# Patient Record
Sex: Female | Born: 1990 | Race: Black or African American | Hispanic: No | Marital: Single | Smoking: Current every day smoker
Health system: Southern US, Community
[De-identification: ages and names within clinical notes are randomized; demographics above are authoritative.]

## PROBLEM LIST (undated history)

## (undated) ENCOUNTER — Inpatient Hospital Stay (HOSPITAL_COMMUNITY): Payer: Self-pay

## (undated) DIAGNOSIS — J45909 Unspecified asthma, uncomplicated: Secondary | ICD-10-CM

## (undated) DIAGNOSIS — J302 Other seasonal allergic rhinitis: Secondary | ICD-10-CM

## (undated) DIAGNOSIS — N739 Female pelvic inflammatory disease, unspecified: Secondary | ICD-10-CM

## (undated) HISTORY — PX: EYE SURGERY: SHX253

---

## 2012-11-16 ENCOUNTER — Encounter (HOSPITAL_COMMUNITY): Payer: Self-pay | Admitting: Emergency Medicine

## 2012-11-16 ENCOUNTER — Emergency Department (INDEPENDENT_AMBULATORY_CARE_PROVIDER_SITE_OTHER)
Admission: EM | Admit: 2012-11-16 | Discharge: 2012-11-16 | Disposition: A | Discharge status: Home / Self Care | Payer: Self-pay | Source: Home / Self Care | Attending: Emergency Medicine | Admitting: Emergency Medicine

## 2012-11-16 DIAGNOSIS — J309 Allergic rhinitis, unspecified: Secondary | ICD-10-CM

## 2012-11-16 DIAGNOSIS — L259 Unspecified contact dermatitis, unspecified cause: Secondary | ICD-10-CM

## 2012-11-16 DIAGNOSIS — Z8669 Personal history of other diseases of the nervous system and sense organs: Secondary | ICD-10-CM

## 2012-11-16 DIAGNOSIS — L309 Dermatitis, unspecified: Secondary | ICD-10-CM

## 2012-11-16 DIAGNOSIS — R197 Diarrhea, unspecified: Secondary | ICD-10-CM

## 2012-11-16 HISTORY — DX: Unspecified asthma, uncomplicated: J45.909

## 2012-11-16 MED ORDER — TRIAMCINOLONE ACETONIDE 0.1 % EX CREA: TOPICAL_CREAM | Freq: Three times a day (TID) | CUTANEOUS | Status: DC

## 2012-11-16 MED ORDER — SUMATRIPTAN SUCCINATE 50 MG PO TABS: 50.0000 mg | ORAL_TABLET | ORAL | Status: DC | PRN

## 2012-11-16 MED ORDER — HYDROXYZINE HCL 25 MG PO TABS: 25.0000 mg | ORAL_TABLET | Freq: Four times a day (QID) | ORAL | Status: DC

## 2012-11-16 MED ORDER — PREDNISONE 20 MG PO TABS: 20.0000 mg | ORAL_TABLET | Freq: Two times a day (BID) | ORAL | Status: DC

## 2012-11-16 MED ORDER — METHYLPREDNISOLONE ACETATE 80 MG/ML IJ SUSP
INTRAMUSCULAR | Status: AC
  Filled 2012-11-16: qty 1

## 2012-11-16 MED ORDER — METHYLPREDNISOLONE ACETATE 80 MG/ML IJ SUSP
80.0000 mg | Freq: Once | INTRAMUSCULAR | Status: AC
  Administered 2012-11-16: 80 mg via INTRAMUSCULAR

## 2012-11-16 NOTE — ED Provider Notes (Signed)
Chief Complaint:   Chief Complaint  Patient presents with  . Rash    History of Present Illness:   Christina Knapp is a 22 year old female who has a two-day history of a rash. This first broke out on her right shoulder and it spread down the right arm and the left arm as well involving her trunk and her legs. She has lesions on her hands and feet but on the palms or soles. She has no lesions on her face, head, or neck, no intraoral lesions or sore throat. She denies any fever, chills, aches, or sweats. She's had no URI symptoms. She denies any changes in soaps, detergents, washing powders, dryer sheets, or fabric softeners. No exposure to cosmetics or chemicals at work or at home. She works as a Conservation officer, nature at [employer]. No one around her has had a similar rash. She denies any recent travel or hotels stays. She has had no trouble breathing and denies any swelling of the lips, tongue, or throat. She notes that she's never had chickenpox.  She has had a long-standing history of trouble with her bowel movements with first constipation and now loose stools. She also has occasional migraine headaches and symptoms of allergic rhinitis. She doesn't have a primary care doctor.  Review of Systems:  Other than noted above, the patient denies any of the following symptoms: Systemic:  No fever, chills, sweats, weight loss, or fatigue. ENT:  No nasal congestion, rhinorrhea, sore throat, swelling of lips, tongue or throat. Resp:  No cough, wheezing, or shortness of breath. Skin:  No rash, itching, nodules, or suspicious lesions.  PMFSH:  Past medical history, family history, social history, meds, and allergies were reviewed. She has a [employer] fast streamer. She has a history of preeclampsia and asthma.  Physical Exam:   Vital signs:  BP 115/66  Pulse 68  Temp(Src) 98 F (36.7 C) (Oral)  Resp 16  SpO2 100% Gen:  Alert, oriented, in no distress. ENT:  Pharynx clear, no intraoral lesions, moist mucous membranes. Lungs:  Clear  to auscultation. Skin:  There is scattered erythematous maculopapules. No vesicles. These involve the trunk, arms, hands, legs, and feet but none of the palms or soles. There no intraoral lesions and no lesions on the face or neck.  Assessment:  The primary encounter diagnosis was Dermatitis. Diagnoses of Diarrhea, History of migraine headaches, and Allergic rhinitis were also pertinent to this visit.  Dermatitis, possibly viral infection or allergy. Does not appear to be chickenpox.  Plan:   1.  The following meds were prescribed:   Discharge Medication List as of 11/16/2012  6:44 PM    START taking these medications   Details  hydrOXYzine (ATARAX/VISTARIL) 25 MG tablet Take 1 tablet (25 mg total) by mouth every 6 (six) hours., Starting 11/16/2012, Until Discontinued, Normal    predniSONE (DELTASONE) 20 MG tablet Take 1 tablet (20 mg total) by mouth 2 (two) times daily., Starting 11/16/2012, Until Discontinued, Normal    SUMAtriptan (IMITREX) 50 MG tablet Take 1 tablet (50 mg total) by mouth every 2 (two) hours as needed for migraine., Starting 11/16/2012, Until Discontinued, Normal    triamcinolone cream (KENALOG) 0.1 % Apply topically 3 (three) times daily., Starting 11/16/2012, Until Discontinued, Normal       2.  The patient was instructed in symptomatic care and handouts were given. 3.  The patient was told to return if becoming worse in any way, if no better in 3 or 4 days, and given some red flag  symptoms such as fever or worsening rash that would indicate earlier return. 4.  Follow up here if necessary. Also suggested she followup with Dr. Charlott Rakes with regard to her problem with diarrhea. I suggested she try a probiotic. She also needs a primary care physician and was given the name of the [facility] and [facility].     Reuben Likes, MD 11/16/12 [phone]

## 2012-11-16 NOTE — ED Notes (Signed)
C/o rash that appeared last night. Pt denies any changes in soaps or detergents.  Denies any recent hotel stays etc.

## 2013-01-15 ENCOUNTER — Encounter (HOSPITAL_COMMUNITY): Payer: Self-pay | Admitting: *Deleted

## 2013-01-15 ENCOUNTER — Inpatient Hospital Stay (HOSPITAL_COMMUNITY)
Admission: AD | Admit: 2013-01-15 | Discharge: 2013-01-15 | Disposition: A | Discharge status: Home / Self Care | Payer: Self-pay | Source: Ambulatory Visit | Attending: Obstetrics & Gynecology | Admitting: Obstetrics & Gynecology

## 2013-01-15 DIAGNOSIS — Z30432 Encounter for removal of intrauterine contraceptive device: Secondary | ICD-10-CM | POA: Insufficient documentation

## 2013-01-15 DIAGNOSIS — Z3009 Encounter for other general counseling and advice on contraception: Secondary | ICD-10-CM

## 2013-01-15 DIAGNOSIS — N938 Other specified abnormal uterine and vaginal bleeding: Secondary | ICD-10-CM | POA: Insufficient documentation

## 2013-01-15 DIAGNOSIS — R102 Pelvic and perineal pain: Secondary | ICD-10-CM

## 2013-01-15 DIAGNOSIS — N939 Abnormal uterine and vaginal bleeding, unspecified: Secondary | ICD-10-CM

## 2013-01-15 DIAGNOSIS — N949 Unspecified condition associated with female genital organs and menstrual cycle: Secondary | ICD-10-CM | POA: Insufficient documentation

## 2013-01-15 HISTORY — DX: Other seasonal allergic rhinitis: J30.2

## 2013-01-15 HISTORY — DX: Female pelvic inflammatory disease, unspecified: N73.9

## 2013-01-15 LAB — URINALYSIS, ROUTINE W REFLEX MICROSCOPIC
Glucose, UA: NEGATIVE mg/dL
Hgb urine dipstick: NEGATIVE
Specific Gravity, Urine: >1.03 — ABNORMAL HIGH (ref 1.005–1.030)
Urobilinogen, UA: 0.2 mg/dL (ref 0.0–1.0)
pH: 6 (ref 5.0–8.0)

## 2013-01-15 LAB — WET PREP, GENITAL
Trich, Wet Prep: NONE SEEN
Yeast Wet Prep HPF POC: NONE SEEN

## 2013-01-15 LAB — POCT PREGNANCY, URINE: Preg Test, Ur: NEGATIVE

## 2013-01-15 NOTE — MAU Note (Signed)
C/o spotting pinkish discharge with cramping; has had IUD for 6 months; has infant son that is 7 months;

## 2013-01-15 NOTE — MAU Provider Note (Signed)
Chief Complaint: Pelvic Pain and Vaginal Bleeding   First Provider Initiated Contact with Patient 01/15/13 1757     SUBJECTIVE HPI: Christina Knapp is a 22 y.o. [id number] who presents to maternity admissions reporting cramping/sharp pain in her pelvis and spotting intermittently x6 months, since her Mirena IUD was placed by her provider in [place], [place].  She had negative UPTs at home but was concerned so came to be tested here.  She is unhappy with the Mirena if that is the cause of her symptoms.  Pt had Mirena prior to this one that did not cause symptoms this severe.  She reports she was going through divorce following the birth of her child 7 months ago and did not want to become pregnant again with her husband, but if she became pregnant now with her boyfriend, she would be happy about the pregnancy.  She denies LOF, vaginal itching/burning, urinary symptoms, h/a, dizziness, n/v, or fever/chills.     Past Medical History  Diagnosis Date  . Asthma   . Seasonal allergies   . PID (pelvic inflammatory disease)    Past Surgical History  Procedure Laterality Date  . Eye surgery    . Eye surgery     History   Social History  . Marital Status: Single    Spouse Name: N/A    Number of Children: N/A  . Years of Education: N/A   Occupational History  . Not on file.   Social History Main Topics  . Smoking status: Current Every Day Smoker -- 1.00 packs/day    Types: Cigarettes  . Smokeless tobacco: Not on file  . Alcohol Use: Yes  . Drug Use: No  . Sexual Activity: Yes    Birth Control/ Protection: IUD   Other Topics Concern  . Not on file   Social History Narrative  . No narrative on file   No current facility-administered medications on file prior to encounter.   Current Outpatient Prescriptions on File Prior to Encounter  Medication Sig Dispense Refill  . hydrOXYzine (ATARAX/VISTARIL) 25 MG tablet Take 1 tablet (25 mg total) by mouth every 6 (six) hours.  12 tablet  0  .  predniSONE (DELTASONE) 20 MG tablet Take 1 tablet (20 mg total) by mouth 2 (two) times daily.  10 tablet  0  . SUMAtriptan (IMITREX) 50 MG tablet Take 1 tablet (50 mg total) by mouth every 2 (two) hours as needed for migraine.  10 tablet  0  . triamcinolone cream (KENALOG) 0.1 % Apply topically 3 (three) times daily.  30 g  0   No Known Allergies  ROS: Pertinent items in HPI  OBJECTIVE Blood pressure 126/73, pulse 68, temperature 98.3 F (36.8 C), temperature source Oral, resp. rate 16, height 5\' 10"  (1.778 m), weight 81.647 kg (180 lb). GENERAL: Well-developed, well-nourished female in no acute distress.  HEENT: Normocephalic HEART: normal rate RESP: normal effort ABDOMEN: Soft, non-tender EXTREMITIES: Nontender, no edema NEURO: Alert and oriented Pelvic exam: Cervix pink, visually closed, without lesion, scant clear discharge, Mirena string clearly visible, vaginal walls and external genitalia normal Bimanual exam: Cervix 0/long/high, firm, anterior, neg CMT, uterus nontender, nonenlarged, adnexa without tenderness, enlargement, or mass  Mirena removed in MAU without difficulty.  Pt tolerated well.    LAB RESULTS Results for orders placed during the hospital encounter of 01/15/13 (from the past 24 hour(s))  URINALYSIS, ROUTINE W REFLEX MICROSCOPIC     Status: Abnormal   Collection Time    01/15/13  5:00 PM  Result Value Range   Color, Urine AMBER (*) YELLOW   APPearance CLEAR  CLEAR   Specific Gravity, Urine >1.030 (*) 1.005 - 1.030   pH 6.0  5.0 - 8.0   Glucose, UA NEGATIVE  NEGATIVE mg/dL   Hgb urine dipstick NEGATIVE  NEGATIVE   Bilirubin Urine SMALL (*) NEGATIVE   Ketones, ur 15 (*) NEGATIVE mg/dL   Protein, ur NEGATIVE  NEGATIVE mg/dL   Urobilinogen, UA 0.2  0.0 - 1.0 mg/dL   Nitrite NEGATIVE  NEGATIVE   Leukocytes, UA NEGATIVE  NEGATIVE  POCT PREGNANCY, URINE     Status: None   Collection Time    01/15/13  5:11 PM      Result Value Range   Preg Test, Ur  NEGATIVE  NEGATIVE    ASSESSMENT 1. Encounter for IUD removal   2. General counseling and advice for contraceptive management   3. Pelvic pain   4. Vaginal spotting     PLAN Contraceptive counseling provided.  Discussed immediate return of fertility after Mirena removed.  Pt desires pregnancy in next year with her new partner.  No contraception desired.   Discharge home F/U with [facility] as planned when insurance starts Return to MAU as needed    Medication List    ASK your doctor about these medications       hydrOXYzine 25 MG tablet  Commonly known as:  ATARAX/VISTARIL  Take 1 tablet (25 mg total) by mouth every 6 (six) hours.     predniSONE 20 MG tablet  Commonly known as:  DELTASONE  Take 1 tablet (20 mg total) by mouth 2 (two) times daily.     SUMAtriptan 50 MG tablet  Commonly known as:  IMITREX  Take 1 tablet (50 mg total) by mouth every 2 (two) hours as needed for migraine.     triamcinolone cream 0.1 %  Commonly known as:  KENALOG  Apply topically 3 (three) times daily.         Sharen Counter Certified Nurse-Midwife 01/15/2013  6:17 PM

## 2013-01-15 NOTE — MAU Note (Signed)
Had negative HPT but has not the symptoms of pregnancy; UPT in MAU is negative but pt declares that she is pregnant and that it's the IUD that is causing the UPT to be negative; desires to have IUD removed;

## 2013-01-17 NOTE — MAU Provider Note (Signed)
Attestation of Attending Supervision of Advanced Practitioner (CNM/NP): Evaluation and management procedures were performed by the Advanced Practitioner under my supervision and collaboration.  I have reviewed the Advanced Practitioner's note and chart, and I agree with the management and plan.  HARRAWAY-SMITH, Amamda Curbow 12:17 PM

## 2013-04-29 ENCOUNTER — Inpatient Hospital Stay (HOSPITAL_COMMUNITY)
Admission: AD | Admit: 2013-04-29 | Discharge: 2013-04-29 | Disposition: A | Discharge status: Home / Self Care | Payer: Self-pay | Source: Ambulatory Visit | Attending: Obstetrics & Gynecology | Admitting: Obstetrics & Gynecology

## 2013-04-29 ENCOUNTER — Encounter (HOSPITAL_COMMUNITY): Payer: Self-pay

## 2013-04-29 ENCOUNTER — Inpatient Hospital Stay (HOSPITAL_COMMUNITY): Payer: Self-pay

## 2013-04-29 DIAGNOSIS — O360110 Maternal care for anti-D [Rh] antibodies, first trimester, not applicable or unspecified: Secondary | ICD-10-CM

## 2013-04-29 DIAGNOSIS — F17200 Nicotine dependence, unspecified, uncomplicated: Secondary | ICD-10-CM | POA: Insufficient documentation

## 2013-04-29 DIAGNOSIS — R51 Headache: Secondary | ICD-10-CM | POA: Insufficient documentation

## 2013-04-29 DIAGNOSIS — O0990 Supervision of high risk pregnancy, unspecified, unspecified trimester: Secondary | ICD-10-CM

## 2013-04-29 DIAGNOSIS — Z3201 Encounter for pregnancy test, result positive: Secondary | ICD-10-CM | POA: Insufficient documentation

## 2013-04-29 DIAGNOSIS — M545 Low back pain, unspecified: Secondary | ICD-10-CM | POA: Insufficient documentation

## 2013-04-29 DIAGNOSIS — O9989 Other specified diseases and conditions complicating pregnancy, childbirth and the puerperium: Secondary | ICD-10-CM

## 2013-04-29 DIAGNOSIS — R1084 Generalized abdominal pain: Secondary | ICD-10-CM

## 2013-04-29 DIAGNOSIS — O99891 Other specified diseases and conditions complicating pregnancy: Secondary | ICD-10-CM

## 2013-04-29 LAB — HCG, QUANTITATIVE, PREGNANCY: HCG, BETA CHAIN, QUANT, S: 1417 m[IU]/mL — AB (ref <5)

## 2013-04-29 LAB — URINALYSIS, ROUTINE W REFLEX MICROSCOPIC
Bilirubin Urine: NEGATIVE
Glucose, UA: NEGATIVE mg/dL
Hgb urine dipstick: NEGATIVE
KETONES UR: NEGATIVE mg/dL
LEUKOCYTES UA: NEGATIVE
NITRITE: NEGATIVE
PH: 6 (ref 5.0–8.0)
Protein, ur: NEGATIVE mg/dL
UROBILINOGEN UA: 0.2 mg/dL (ref 0.0–1.0)

## 2013-04-29 LAB — CBC
HCT: 39 % (ref 36.0–46.0)
Hemoglobin: 13.5 g/dL (ref 12.0–15.0)
MCH: 31.2 pg (ref 26.0–34.0)
MCHC: 34.6 g/dL (ref 30.0–36.0)
MCV: 90.1 fL (ref 78.0–100.0)
PLATELETS: 158 10*3/uL (ref 150–400)
RBC: 4.33 MIL/uL (ref 3.87–5.11)
RDW: 12.3 % (ref 11.5–15.5)
WBC: 4.7 10*3/uL (ref 4.0–10.5)

## 2013-04-29 LAB — WET PREP, GENITAL
Trich, Wet Prep: NONE SEEN
Yeast Wet Prep HPF POC: NONE SEEN

## 2013-04-29 LAB — POCT PREGNANCY, URINE: Preg Test, Ur: POSITIVE — AB

## 2013-04-29 MED ORDER — ACETAMINOPHEN 500 MG PO TABS
1000.0000 mg | ORAL_TABLET | ORAL | Status: AC
  Administered 2013-04-29: 1000 mg via ORAL
  Filled 2013-04-29: qty 2

## 2013-04-29 NOTE — Discharge Instructions (Signed)
Ectopic Pregnancy °An ectopic pregnancy is when the fertilized egg attaches (implants) outside the uterus. Most ectopic pregnancies occur in the fallopian tube. Rarely do ectopic pregnancies occur on the ovary, intestine, pelvis, or cervix. In an ectopic pregnancy, the fertilized egg does not have the ability to develop into a normal, healthy baby.  °A ruptured ectopic pregnancy is one in which the fallopian tube gets torn or bursts and results in internal bleeding. Often there is intense abdominal pain, and sometimes, vaginal bleeding. Having an ectopic pregnancy can be life threatening. If left untreated, this dangerous condition can lead to a blood transfusion, abdominal surgery, or even death. °CAUSES  °Damage to the fallopian tubes is the suspected cause in most ectopic pregnancies.  °RISK FACTORS °Depending on your circumstances, the risk of having an ectopic pregnancy will vary. The level of risk can be divided into three categories. °High Risk °· You have gone through infertility treatment. °· You have had a previous ectopic pregnancy. °· You have had previous tubal surgery. °· You have had previous surgery to have the fallopian tubes tied (tubal ligation). °· You have tubal problems or diseases. °· You have been exposed to DES. DES is a medicine that was used until 1971 and had effects on babies whose mothers took the medicine. °· You become pregnant while using an intrauterine device (IUD) for birth control.  °Moderate Risk °· You have a history of infertility. °· You have a history of a sexually transmitted infection (STI). °· You have a history of pelvic inflammatory disease (PID). °· You have scarring from endometriosis. °· You have multiple sexual partners. °· You smoke.  °Low Risk °· You have had previous pelvic surgery. °· You use vaginal douching. °· You became sexually active before 24 years of age. °SIGNS AND SYMPTOMS  °An ectopic pregnancy should be suspected in anyone who has missed a period and  has abdominal pain or bleeding. °· You may experience normal pregnancy symptoms, such as: °· Nausea. °· Tiredness. °· Breast tenderness. °· Other symptoms may include: °· Pain with intercourse. °· Irregular vaginal bleeding or spotting. °· Cramping or pain on one side or in the lower abdomen. °· Fast heartbeat. °· Passing out while having a bowel movement. °· Symptoms of a ruptured ectopic pregnancy and internal bleeding may include: °· Sudden, severe pain in the abdomen and pelvis. °· Dizziness or fainting. °· Pain in the shoulder area. °DIAGNOSIS  °Tests that may be performed include: °· A pregnancy test. °· An ultrasound test. °· Testing the specific level of pregnancy hormone in the bloodstream. °· Taking a sample of uterus tissue (dilation and curettage, D&C). °· Surgery to perform a visual exam of the inside of the abdomen using a thin, lighted tube with a tiny camera on the end (laparoscope). °TREATMENT  °An injection of a medicine called methotrexate may be given. This medicine causes the pregnancy tissue to be absorbed. It is given if: °· The diagnosis is made early. °· The fallopian tube has not ruptured. °· You are considered to be a good candidate for the medicine. °Usually, pregnancy hormone blood levels are checked after methotrexate treatment. This is to be sure the medicine is effective. It may take 4 6 weeks for the pregnancy to be absorbed (though most pregnancies will be absorbed by 3 weeks). °Surgical treatment may be needed. A laparoscope may be used to remove the pregnancy tissue. If severe internal bleeding occurs, a cut (incision) may be made in the lower abdomen (laparotomy), and the   ectopic pregnancy is removed. This stops the bleeding. Part of the fallopian tube, or the whole tube, may be removed as well (salpingectomy). After surgery, pregnancy hormone tests may be done to be sure there is no pregnancy tissue left. You may receive an Rho(D) immune globulin shot if you are Rh negative and  the father is Rh positive, or if you do not know the Rh type of the father. This is to prevent problems with any future pregnancy. °SEEK IMMEDIATE MEDICAL CARE IF:  °You have any symptoms of an ectopic pregnancy. This is a medical emergency. °Document Released: 04/21/2004 Document Revised: 01/02/2013 Document Reviewed: 10/11/2012 °ExitCare® Patient Information ©2014 [employer]. ° °

## 2013-04-29 NOTE — MAU Provider Note (Signed)
History     CSN: [id number]  Arrival date and time: 04/29/13 1016   First Provider Initiated Contact with Patient 04/29/13 1340      Chief Complaint  Patient presents with  . Pregnancy Test    HPI Ms. Christina Knapp is a 23 y.o. [id number] at 683w6d by LMP who presents with a 2-day history of headache and lower back pain.  She denies vaginal discharge, vaginal bleeding, vaginal odor, itching, and burning. She has not taken medication for her headache or back pain, and states she has not eaten and is dehydrated. She reports nausea and photosensitivity, but denies vomiting. The headache is described as constant pain rated 9/10. She has not yet received prenatal care for this pregnancy and wishes [facility].  During her second pregnancy, she states that she did not receive Rhogam. The baby was born at 36 weeks and required 2 units of blood at birth.   Past Medical History  Diagnosis Date  . Asthma   . Seasonal allergies   . PID (pelvic inflammatory disease)     Past Surgical History  Procedure Laterality Date  . Eye surgery    . Eye surgery      Family History  Problem Relation Age of Onset  . Hypertension Mother   . Hypertension Maternal Grandmother   . Cancer Maternal Grandmother   . Hypertension Maternal Grandfather   . Diabetes Maternal Grandfather     History  Substance Use Topics  . Smoking status: Current Every Day Smoker -- 0.25 packs/day    Types: Cigarettes  . Smokeless tobacco: Not on file  . Alcohol Use: Yes    Allergies: No Known Allergies  No prescriptions prior to admission    Review of Systems  Eyes: Negative for blurred vision and double vision.  Gastrointestinal: Positive for nausea. Negative for vomiting.  Genitourinary: Negative for dysuria.  Musculoskeletal: Positive for back pain.       Low back pain, began 2 days ago  Neurological: Positive for headaches.   Physical Exam   Blood pressure 133/80, pulse 76,  temperature 99.1 F (37.3 C), temperature source Oral, resp. rate 18, height 5\' 10"  (1.778 m), weight 81.761 kg (180 lb 4 oz), last menstrual period 03/26/2013.  Physical Exam  Constitutional: She appears well-developed and well-nourished.  photosensitivity  Genitourinary: Uterus is not tender. Cervix exhibits no motion tenderness. Right adnexum displays no tenderness. Left adnexum displays no tenderness. No tenderness or bleeding around the vagina. No vaginal discharge found.  Abdomen: soft, non-tender, no rebound tenderness or guarding Back: Negative CVA tenderness  Results for orders placed during the hospital encounter of 04/29/13 (from the past 72 hour(s))  URINALYSIS, ROUTINE W REFLEX MICROSCOPIC     Status: Abnormal   Collection Time    04/29/13 11:41 AM      Result Value Range   Color, Urine YELLOW  YELLOW   APPearance CLEAR  CLEAR   Specific Gravity, Urine >1.030 (*) 1.005 - 1.030   pH 6.0  5.0 - 8.0   Glucose, UA NEGATIVE  NEGATIVE mg/dL   Hgb urine dipstick NEGATIVE  NEGATIVE   Bilirubin Urine NEGATIVE  NEGATIVE   Ketones, ur NEGATIVE  NEGATIVE mg/dL   Protein, ur NEGATIVE  NEGATIVE mg/dL   Urobilinogen, UA 0.2  0.0 - 1.0 mg/dL   Nitrite NEGATIVE  NEGATIVE   Leukocytes, UA NEGATIVE  NEGATIVE   Comment: MICROSCOPIC NOT DONE ON URINES WITH NEGATIVE PROTEIN, BLOOD, LEUKOCYTES, NITRITE, OR  GLUCOSE <1000 mg/dL.  POCT PREGNANCY, URINE     Status: Abnormal   Collection Time    04/29/13 11:43 AM      Result Value Range   Preg Test, Ur POSITIVE (*) NEGATIVE   Comment:            THE SENSITIVITY OF THIS     METHODOLOGY IS >24 mIU/mL  WET PREP, GENITAL     Status: Abnormal   Collection Time    04/29/13  2:27 PM      Result Value Range   Yeast Wet Prep HPF POC NONE SEEN  NONE SEEN   Trich, Wet Prep NONE SEEN  NONE SEEN   Clue Cells Wet Prep HPF POC FEW (*) NONE SEEN   WBC, Wet Prep HPF POC MODERATE (*) NONE SEEN   Comment: MODERATE BACTERIA SEEN  GC/CHLAMYDIA PROBE AMP      Status: None   Collection Time    04/29/13  2:27 PM      Result Value Range   CT Probe RNA NEGATIVE  NEGATIVE   GC Probe RNA NEGATIVE  NEGATIVE   Comment: (NOTE)                                                                                               **Normal Reference Range: Negative**          Assay performed using the Gen-Probe APTIMA COMBO2 (R) Assay.     Acceptable specimen types for this assay include APTIMA Swabs (Unisex,     endocervical, urethral, or vaginal), first void urine, and ThinPrep     liquid based cytology samples.     Performed at [facility]  CBC     Status: None   Collection Time    04/29/13  2:30 PM      Result Value Range   WBC 4.7  4.0 - 10.5 K/uL   RBC 4.33  3.87 - 5.11 MIL/uL   Hemoglobin 13.5  12.0 - 15.0 g/dL   HCT 16.1  09.6 - 04.5 %   MCV 90.1  78.0 - 100.0 fL   MCH 31.2  26.0 - 34.0 pg   MCHC 34.6  30.0 - 36.0 g/dL   RDW 40.9  81.1 - 91.4 %   Platelets 158  150 - 400 K/uL  HCG, QUANTITATIVE, PREGNANCY     Status: Abnormal   Collection Time    04/29/13  2:30 PM      Result Value Range   hCG, Beta Chain, Quant, S 1417 (*) <5 mIU/mL   Comment:              GEST. AGE      CONC.  (mIU/mL)       <=1 WEEK        5 - 50         2 WEEKS       50 - 500         3 WEEKS       100 - 10,000         4 WEEKS     1,000 -  30,000         5 WEEKS     3,500 - 115,000       6-8 WEEKS     12,000 - 270,000        12 WEEKS     15,000 - 220,000                FEMALE AND NON-PREGNANT FEMALE:         LESS THAN 5 mIU/mL  TYPE AND SCREEN     Status: None   Collection Time    04/29/13  2:30 PM      Result Value Range   ABO/RH(D) A NEG     Antibody Screen POS     Sample Expiration 05/02/2013     DAT, IgG NEG     Antibody Identification ANTI-D     US Ob Comp Less 14 Wks  04/29/2013   CLINICAL DATA:  Abdominal and back pain  EXAM: OBSTETRIC <14 WK [place] AND TRANSVAGINAL OB US  TECHNIQUE: Both transabdominal and transvaginal ultrasound examinations were  performed for complete evaluation of the gestation as well as the maternal uterus, adnexal regions, and pelvic cul-de-sac. Transvaginal technique was performed to assess early pregnancy.  COMPARISON:  None.  FINDINGS: Intrauterine gestational sac: Single  Yolk sac:  No  Embryo:  No  Cardiac Activity: No  Heart Rate:  Not applicable bpm  MSD:  3.7  mm   4 w   6  d  CRL:     mm    w  d                  [place] EDC: 12/31/2013  Maternal uterus/adnexae:  Subchorionic hemorrhage: None  Right ovary: Normal  Left ovary: Normal  Other :None  Free fluid:  None  IMPRESSION: 1. Single intrauterine gestational sac. No embryo or yolk sac identified. Probable early intrauterine gestational sac, but no yolk sac, fetal pole, or cardiac activity yet visualized. Recommend follow-up quantitative B-HCG levels and follow-up US in 14 days to confirm and assess viability. This recommendation follows SRU consensus guidelines: Diagnostic Criteria for Nonviable Pregnancy Early in the First Trimester. Malva Limes Med 2013; 960:4540-98.   Electronically Signed   By: Signa Kell M.D.   On: 04/29/2013 15:34   US Ob Transvaginal  04/29/2013   CLINICAL DATA:  Abdominal and back pain  EXAM: OBSTETRIC <14 WK [place] AND TRANSVAGINAL OB US  TECHNIQUE: Both transabdominal and transvaginal ultrasound examinations were performed for complete evaluation of the gestation as well as the maternal uterus, adnexal regions, and pelvic cul-de-sac. Transvaginal technique was performed to assess early pregnancy.  COMPARISON:  None.  FINDINGS: Intrauterine gestational sac: Single  Yolk sac:  No  Embryo:  No  Cardiac Activity: No  Heart Rate:  Not applicable bpm  MSD:  3.7  mm   4 w   6  d  CRL:     mm    w  d                  [place] EDC: 12/31/2013  Maternal uterus/adnexae:  Subchorionic hemorrhage: None  Right ovary: Normal  Left ovary: Normal  Other :None  Free fluid:  None  IMPRESSION: 1. Single intrauterine gestational sac. No embryo or yolk sac identified. Probable early  intrauterine gestational sac, but no yolk sac, fetal pole, or cardiac activity yet visualized. Recommend follow-up quantitative B-HCG levels and follow-up US in 14 days to confirm and assess  viability. This recommendation follows SRU consensus guidelines: Diagnostic Criteria for Nonviable Pregnancy Early in the First Trimester. Malva Limes Med 2013; 161:0960-45.   Electronically Signed   By: Signa Kell M.D.   On: 04/29/2013 15:34    MAU Course  Procedures  Assessment and Plan   1. High-risk pregnancy   2. Positive pregnancy test   3. Anti-D antibody positive  Consult Dr Erin Fulling D/C home with ectopic precautions F/U in MAU for repeat quant in 48 hours F/U in [facility] once confirm IUP  I have seen this patient and agree with the above student's note.  LEFTWICH-KIRBY, Clarine Elrod 04/29/2013, 2:40 PM

## 2013-04-29 NOTE — MAU Note (Signed)
Pt states for past few days has had headache. Denies bleeding or vag d/c changes. LMP-03/26/2013. Thinks she is pregnant however took home upt 2 wks ago and was negative. Having back and head pain. Is RH negative and missed injection at 28-32 wks with last pregnancy, and was dilated 2cm at 36 weeks.

## 2013-04-30 LAB — GC/CHLAMYDIA PROBE AMP
CT Probe RNA: NEGATIVE
GC Probe RNA: NEGATIVE

## 2013-05-01 ENCOUNTER — Inpatient Hospital Stay (HOSPITAL_COMMUNITY)
Admission: AD | Admit: 2013-05-01 | Discharge: 2013-05-01 | Disposition: A | Discharge status: Home / Self Care | Payer: Self-pay | Source: Ambulatory Visit | Attending: Obstetrics and Gynecology | Admitting: Obstetrics and Gynecology

## 2013-05-01 DIAGNOSIS — O360110 Maternal care for anti-D [Rh] antibodies, first trimester, not applicable or unspecified: Secondary | ICD-10-CM

## 2013-05-01 DIAGNOSIS — R102 Pelvic and perineal pain: Secondary | ICD-10-CM

## 2013-05-01 DIAGNOSIS — O209 Hemorrhage in early pregnancy, unspecified: Secondary | ICD-10-CM | POA: Insufficient documentation

## 2013-05-01 DIAGNOSIS — O26899 Other specified pregnancy related conditions, unspecified trimester: Secondary | ICD-10-CM

## 2013-05-01 DIAGNOSIS — O99330 Smoking (tobacco) complicating pregnancy, unspecified trimester: Secondary | ICD-10-CM | POA: Insufficient documentation

## 2013-05-01 DIAGNOSIS — O360990 Maternal care for other rhesus isoimmunization, unspecified trimester, not applicable or unspecified: Secondary | ICD-10-CM

## 2013-05-01 DIAGNOSIS — O361190 Maternal care for Anti-A sensitization, unspecified trimester, not applicable or unspecified: Secondary | ICD-10-CM | POA: Insufficient documentation

## 2013-05-01 DIAGNOSIS — N949 Unspecified condition associated with female genital organs and menstrual cycle: Secondary | ICD-10-CM | POA: Insufficient documentation

## 2013-05-01 DIAGNOSIS — Z09 Encounter for follow-up examination after completed treatment for conditions other than malignant neoplasm: Secondary | ICD-10-CM | POA: Insufficient documentation

## 2013-05-01 LAB — TYPE AND SCREEN
ABO/RH(D): A NEG
ANTIBODY SCREEN: POSITIVE
DAT, IGG: NEGATIVE

## 2013-05-01 LAB — HCG, QUANTITATIVE, PREGNANCY: hCG, Beta Chain, Quant, S: 2612 m[IU]/mL — ABNORMAL HIGH (ref <5)

## 2013-05-01 NOTE — MAU Provider Note (Signed)
  History     CSN: [id number]  Arrival date and time: 05/01/13 1535   None     Chief Complaint  Patient presents with  . Follow-up   HPI  Christina Knapp is a 23 y.o. [id number] at 3650w1d who presents today for FU HCG. She denies any further cramping or bleeding at this time.   Past Medical History  Diagnosis Date  . Asthma   . Seasonal allergies   . PID (pelvic inflammatory disease)     Past Surgical History  Procedure Laterality Date  . Eye surgery    . Eye surgery      Family History  Problem Relation Age of Onset  . Hypertension Mother   . Hypertension Maternal Grandmother   . Cancer Maternal Grandmother   . Hypertension Maternal Grandfather   . Diabetes Maternal Grandfather     History  Substance Use Topics  . Smoking status: Current Every Day Smoker -- 0.25 packs/day    Types: Cigarettes  . Smokeless tobacco: Not on file  . Alcohol Use: Yes    Allergies: No Known Allergies  No prescriptions prior to admission    ROS Physical Exam   Last menstrual period 03/26/2013.  Physical Exam  Nursing note and vitals reviewed. Constitutional: She is oriented to person, place, and time. She appears well-developed and well-nourished. No distress.  Respiratory: Effort normal.  Neurological: She is alert and oriented to person, place, and time.  Skin: Skin is warm and dry.  Psychiatric: She has a normal mood and affect.    MAU Course  Procedures  Results for Christina BalesCOLLINS, Christina Knapp (MRN [id number]) as of 05/01/2013 16:37  Ref. Range 04/29/2013 14:30 04/29/2013 15:27 05/01/2013 15:55  hCG, Beta Chain, Quant, S Latest Range: <5 mIU/mL 1417 (H)  2612 (H)    Assessment and Plan   1. 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 Pelvic pain complicating pregnancy    Follow-up Information   [facility] (They will call with an appointment time for you. )    Specialty:  Radiology   Contact information:   [address] [id number] [facility] [place] [id number] [phone]       Medication List    Notice   You have not been prescribed any medications.       Tawnya CrookHogan, Jamaury Gumz Donovan 05/01/2013, 4:40 PM

## 2013-05-01 NOTE — Discharge Instructions (Signed)
Ectopic Pregnancy °An ectopic pregnancy is when the fertilized egg attaches (implants) outside the uterus. Most ectopic pregnancies occur in the fallopian tube. Rarely do ectopic pregnancies occur on the ovary, intestine, pelvis, or cervix. In an ectopic pregnancy, the fertilized egg does not have the ability to develop into a normal, healthy baby.  °A ruptured ectopic pregnancy is one in which the fallopian tube gets torn or bursts and results in internal bleeding. Often there is intense abdominal pain, and sometimes, vaginal bleeding. Having an ectopic pregnancy can be life threatening. If left untreated, this dangerous condition can lead to a blood transfusion, abdominal surgery, or even death. °CAUSES  °Damage to the fallopian tubes is the suspected cause in most ectopic pregnancies.  °RISK FACTORS °Depending on your circumstances, the risk of having an ectopic pregnancy will vary. The level of risk can be divided into three categories. °High Risk °· You have gone through infertility treatment. °· You have had a previous ectopic pregnancy. °· You have had previous tubal surgery. °· You have had previous surgery to have the fallopian tubes tied (tubal ligation). °· You have tubal problems or diseases. °· You have been exposed to DES. DES is a medicine that was used until 1971 and had effects on babies whose mothers took the medicine. °· You become pregnant while using an intrauterine device (IUD) for birth control.  °Moderate Risk °· You have a history of infertility. °· You have a history of a sexually transmitted infection (STI). °· You have a history of pelvic inflammatory disease (PID). °· You have scarring from endometriosis. °· You have multiple sexual partners. °· You smoke.  °Low Risk °· You have had previous pelvic surgery. °· You use vaginal douching. °· You became sexually active before 24 years of age. °SIGNS AND SYMPTOMS  °An ectopic pregnancy should be suspected in anyone who has missed a period and  has abdominal pain or bleeding. °· You may experience normal pregnancy symptoms, such as: °· Nausea. °· Tiredness. °· Breast tenderness. °· Other symptoms may include: °· Pain with intercourse. °· Irregular vaginal bleeding or spotting. °· Cramping or pain on one side or in the lower abdomen. °· Fast heartbeat. °· Passing out while having a bowel movement. °· Symptoms of a ruptured ectopic pregnancy and internal bleeding may include: °· Sudden, severe pain in the abdomen and pelvis. °· Dizziness or fainting. °· Pain in the shoulder area. °DIAGNOSIS  °Tests that may be performed include: °· A pregnancy test. °· An ultrasound test. °· Testing the specific level of pregnancy hormone in the bloodstream. °· Taking a sample of uterus tissue (dilation and curettage, D&C). °· Surgery to perform a visual exam of the inside of the abdomen using a thin, lighted tube with a tiny camera on the end (laparoscope). °TREATMENT  °An injection of a medicine called methotrexate may be given. This medicine causes the pregnancy tissue to be absorbed. It is given if: °· The diagnosis is made early. °· The fallopian tube has not ruptured. °· You are considered to be a good candidate for the medicine. °Usually, pregnancy hormone blood levels are checked after methotrexate treatment. This is to be sure the medicine is effective. It may take 4 6 weeks for the pregnancy to be absorbed (though most pregnancies will be absorbed by 3 weeks). °Surgical treatment may be needed. A laparoscope may be used to remove the pregnancy tissue. If severe internal bleeding occurs, a cut (incision) may be made in the lower abdomen (laparotomy), and the   ectopic pregnancy is removed. This stops the bleeding. Part of the fallopian tube, or the whole tube, may be removed as well (salpingectomy). After surgery, pregnancy hormone tests may be done to be sure there is no pregnancy tissue left. You may receive an Rho(D) immune globulin shot if you are Rh negative and  the father is Rh positive, or if you do not know the Rh type of the father. This is to prevent problems with any future pregnancy. °SEEK IMMEDIATE MEDICAL CARE IF:  °You have any symptoms of an ectopic pregnancy. This is a medical emergency. °Document Released: 04/21/2004 Document Revised: 01/02/2013 Document Reviewed: 10/11/2012 °ExitCare® Patient Information ©2014 [employer]. ° °

## 2013-05-01 NOTE — MAU Provider Note (Signed)
Attestation of Attending Supervision of Advanced Practitioner (CNM/NP): Evaluation and management procedures were performed by the Advanced Practitioner under my supervision and collaboration.  I have reviewed the Advanced Practitioner's note and chart, and I agree with the management and plan.  HARRAWAY-SMITH, Adi Doro 5:51 PM    

## 2013-05-02 NOTE — MAU Provider Note (Signed)
Attestation of Attending Supervision of Advanced Practitioner (CNM/NP): Evaluation and management procedures were performed by the Advanced Practitioner under my supervision and collaboration.  I have reviewed the Advanced Practitioner's note and chart, and I agree with the management and plan.  Gayl Ivanoff 05/02/2013 8:34 AM

## 2013-05-09 ENCOUNTER — Inpatient Hospital Stay (HOSPITAL_COMMUNITY)
Admission: AD | Admit: 2013-05-09 | Discharge: 2013-05-09 | Discharge status: Against Medical Advice | Payer: Self-pay | Source: Ambulatory Visit | Attending: Obstetrics & Gynecology | Admitting: Obstetrics & Gynecology

## 2013-05-09 ENCOUNTER — Other Ambulatory Visit (HOSPITAL_COMMUNITY): Payer: Self-pay | Admitting: Advanced Practice Midwife

## 2013-05-09 ENCOUNTER — Inpatient Hospital Stay (HOSPITAL_COMMUNITY)
Admit: 2013-05-09 | Discharge: 2013-05-09 | Disposition: A | Discharge status: Home / Self Care | Payer: Self-pay | Attending: Advanced Practice Midwife | Admitting: Advanced Practice Midwife

## 2013-05-09 DIAGNOSIS — O30019 Twin pregnancy, monochorionic/monoamniotic, unspecified trimester: Secondary | ICD-10-CM | POA: Insufficient documentation

## 2013-05-09 DIAGNOSIS — O99891 Other specified diseases and conditions complicating pregnancy: Secondary | ICD-10-CM | POA: Insufficient documentation

## 2013-05-09 DIAGNOSIS — O30009 Twin pregnancy, unspecified number of placenta and unspecified number of amniotic sacs, unspecified trimester: Secondary | ICD-10-CM | POA: Insufficient documentation

## 2013-05-09 DIAGNOSIS — O9989 Other specified diseases and conditions complicating pregnancy, childbirth and the puerperium: Principal | ICD-10-CM

## 2013-05-09 DIAGNOSIS — R102 Pelvic and perineal pain: Principal | ICD-10-CM

## 2013-05-09 DIAGNOSIS — O26899 Other specified pregnancy related conditions, unspecified trimester: Secondary | ICD-10-CM

## 2014-01-27 ENCOUNTER — Encounter (HOSPITAL_COMMUNITY): Payer: Self-pay

## 2014-03-04 ENCOUNTER — Encounter (HOSPITAL_COMMUNITY): Payer: Self-pay | Admitting: *Deleted

## 2015-12-05 IMAGING — US US OB COMP LESS 14 WK
1 series · 13 of 28 positions shown · non-contrast
Comparison: None.

CLINICAL DATA: Abdominal and back pain

EXAM:
OBSTETRIC <14 WK US AND TRANSVAGINAL OB US
TECHNIQUE: Both transabdominal and transvaginal ultrasound examinations were
performed for complete evaluation of the gestation as well as the
maternal uterus, adnexal regions, and pelvic cul-de-sac.
Transvaginal technique was performed to assess early pregnancy.

[Series 1: us ob comp less 14 wks · 13 of 35 slices shown]
[im 2/35]
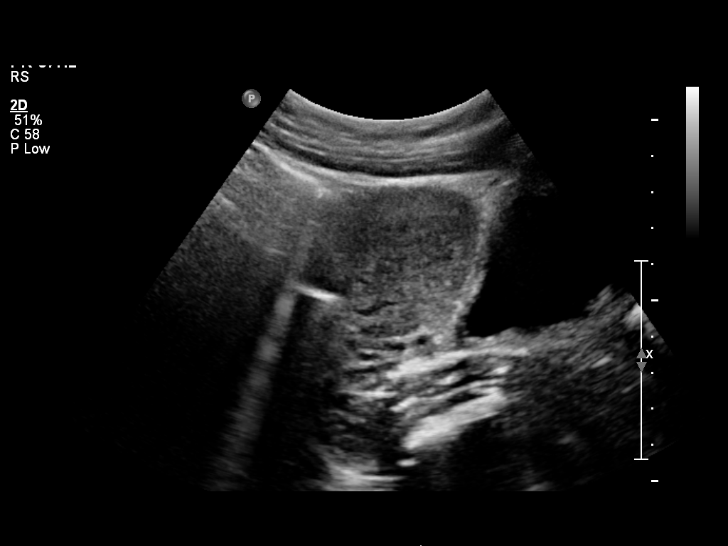
[im 4/35]
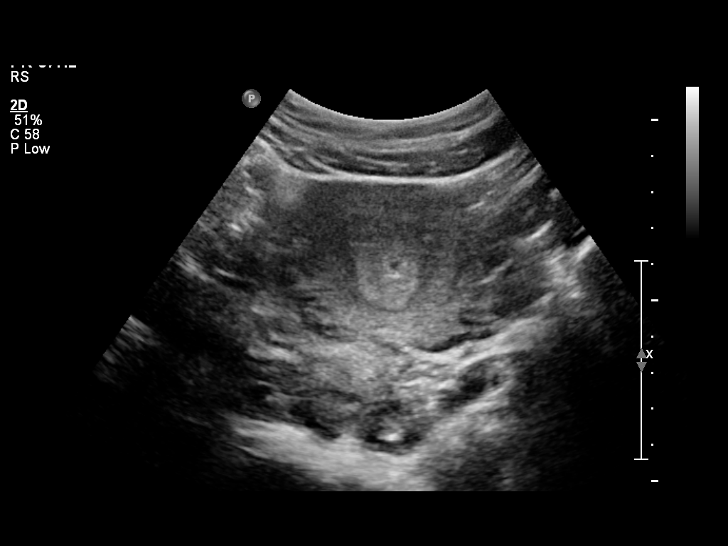
[im 7/35]
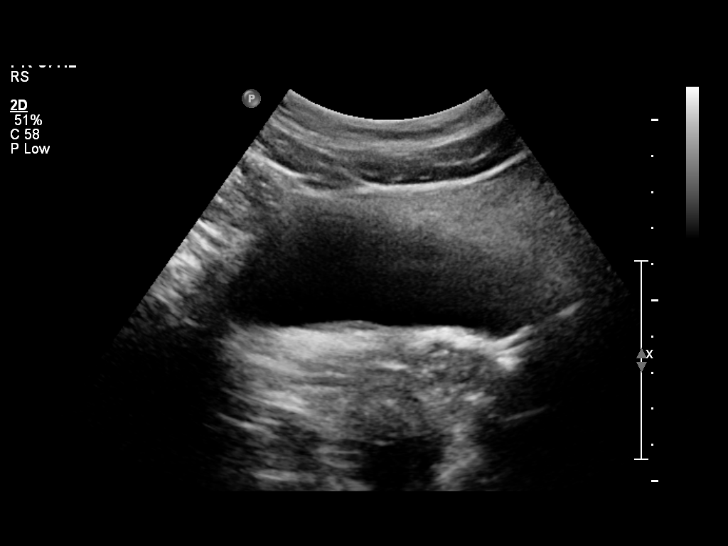
[im 9/35]
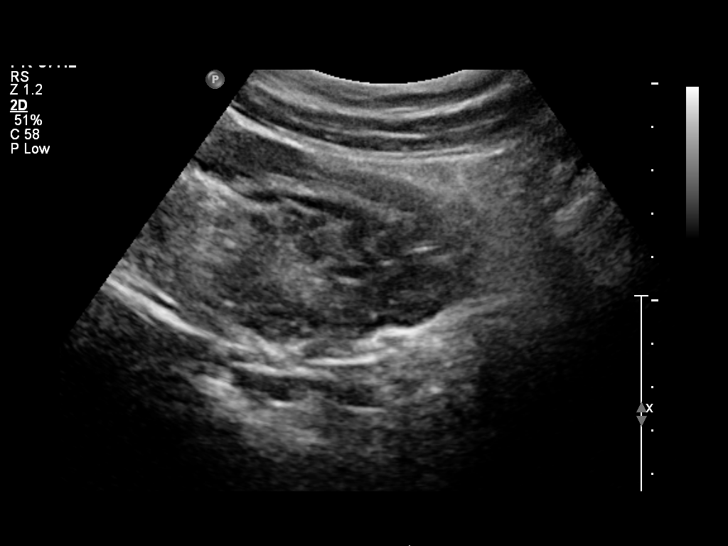
[im 12/35]
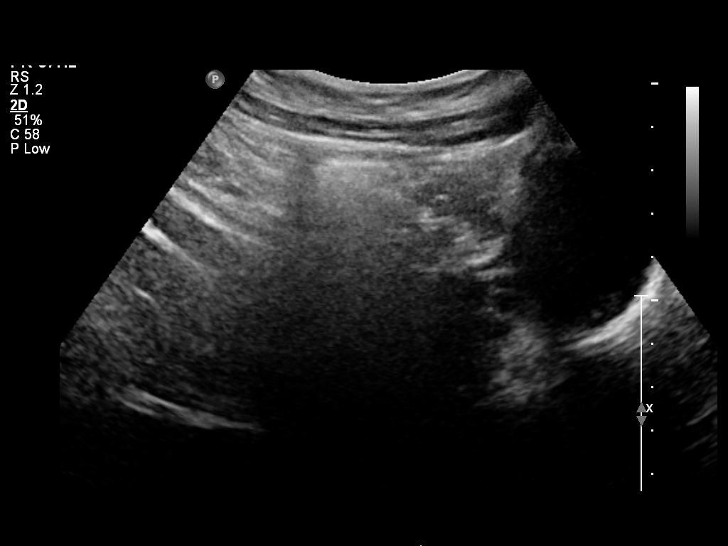
[im 14/35]
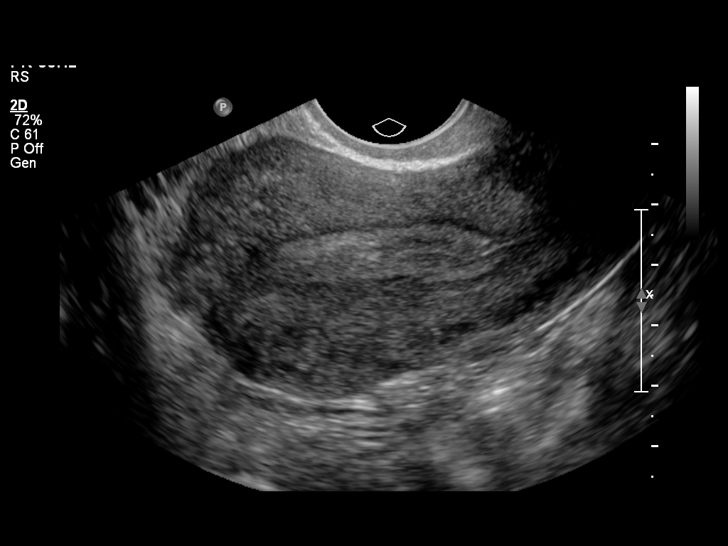
[im 18/35]
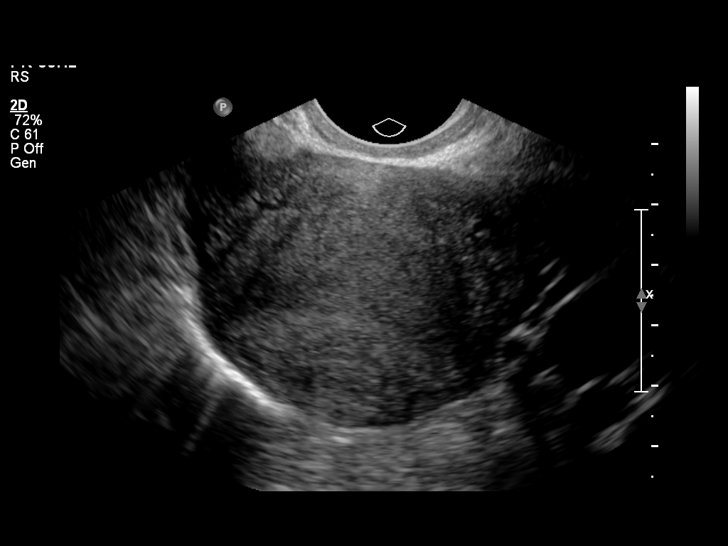
[im 21/35]
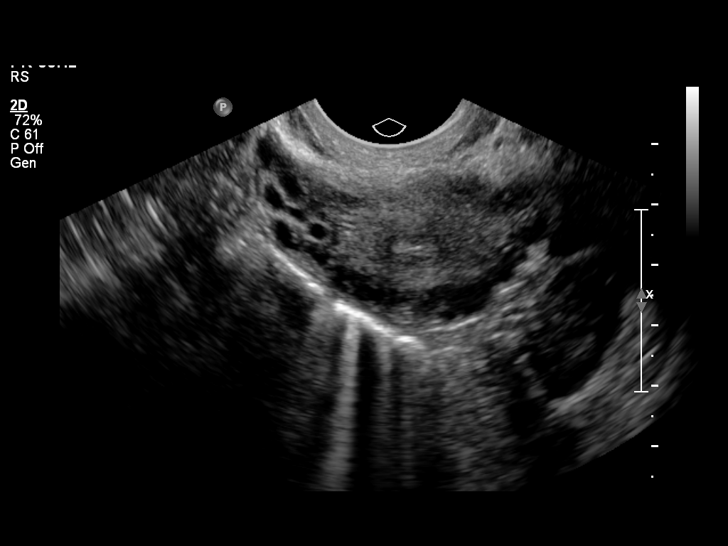
[im 23/35]
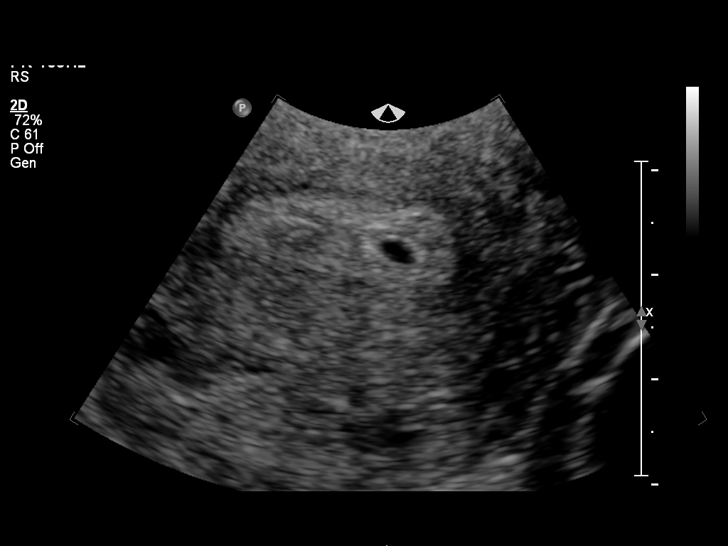
[im 26/35]
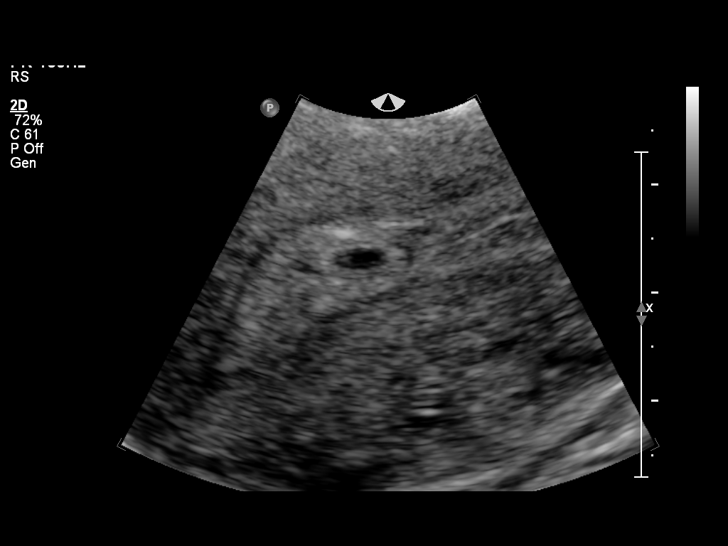
[im 28/35]
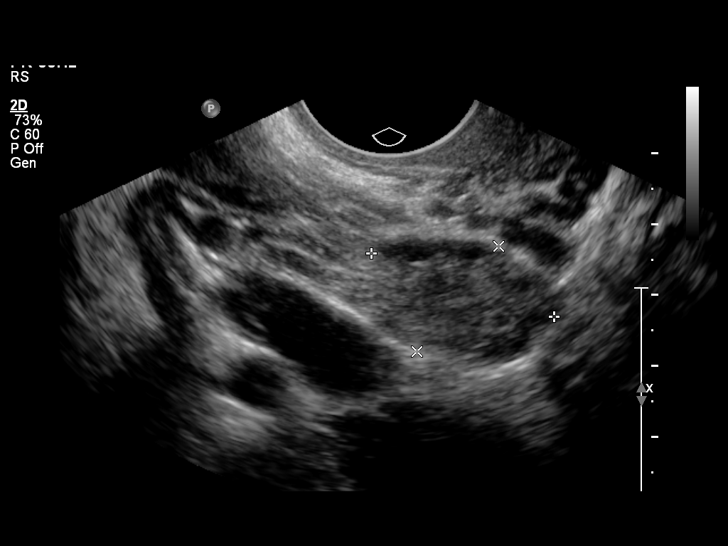
[im 31/35]
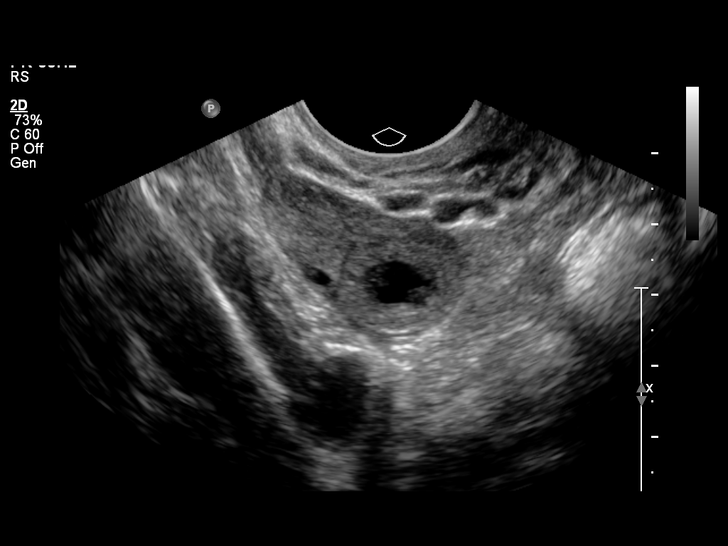
[im 33/35]
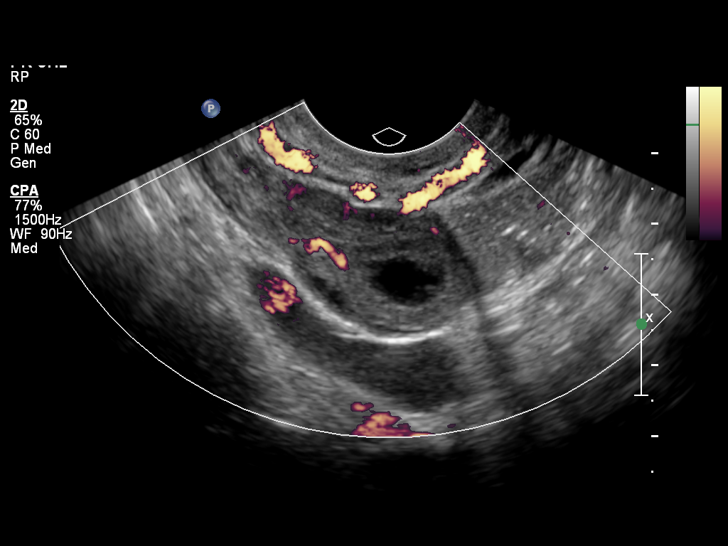

[13 of 28 positions shown; findings below may reference images not displayed]

FINDINGS: Intrauterine gestational sac: Single

Yolk sac:  No

Embryo:  No

Cardiac Activity: No

Heart Rate:  Not applicable bpm

MSD:  3.7  mm   4 w   6  d

CRL:     mm    w  d                  US EDC: 12/31/2013

Maternal uterus/adnexae:

Subchorionic hemorrhage: None

Right ovary: Normal

Left ovary: Normal

Other :None

Free fluid:  None
IMPRESSION: 1. Single intrauterine gestational sac. No embryo or yolk sac
identified. Probable early intrauterine gestational sac, but no yolk
sac, fetal pole, or cardiac activity yet visualized. Recommend
follow-up quantitative B-HCG levels and follow-up US in 14 days to
confirm and assess viability. This recommendation follows SRU
consensus guidelines: Diagnostic Criteria for Nonviable Pregnancy
Early in the First Trimester. N Engl J Med 8489; [DATE].

## 2015-12-15 IMAGING — US US OB TRANSVAGINAL
1 series · 14 of 26 positions shown · non-contrast
Comparison: 04/29/2013

CLINICAL DATA: Followup viability.

EXAM:
US OB TRANSVAGINAL

[Series 1: us ob comp less 14 wks · 26 acquisitions, 14 frames shown]
[im 1/26]
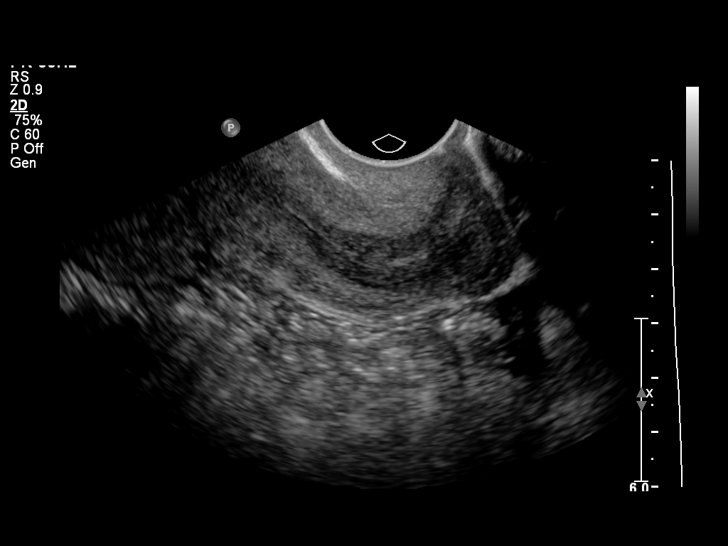
[im 3/26]
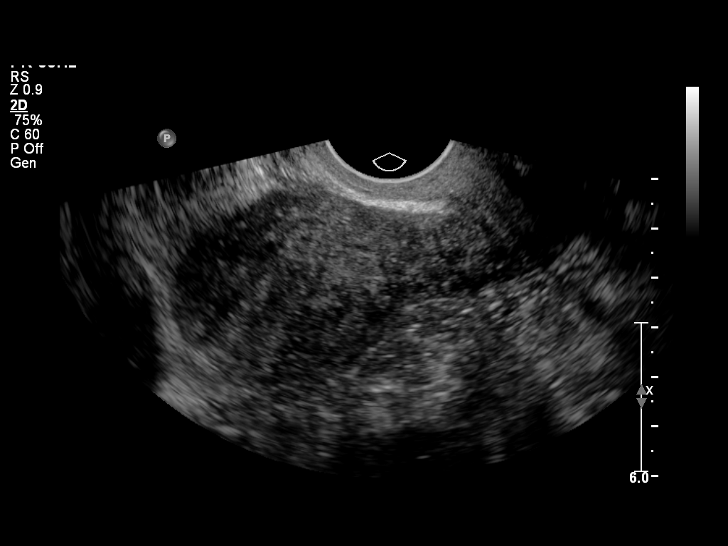
[im 5/26]
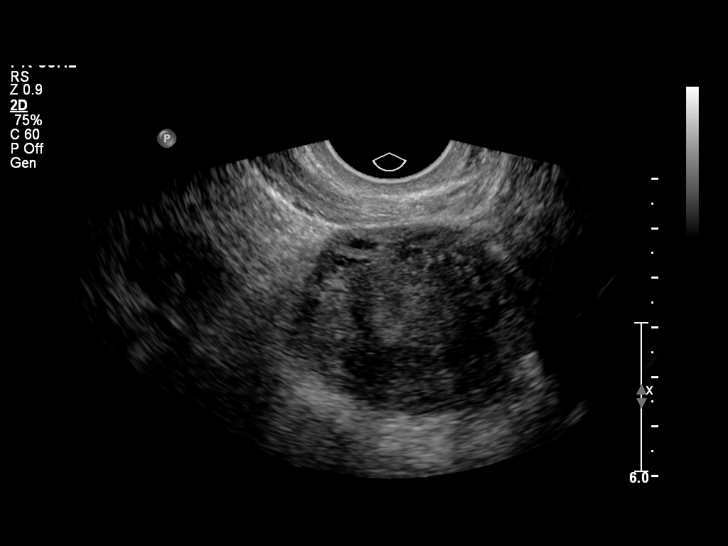
[im 7/26]
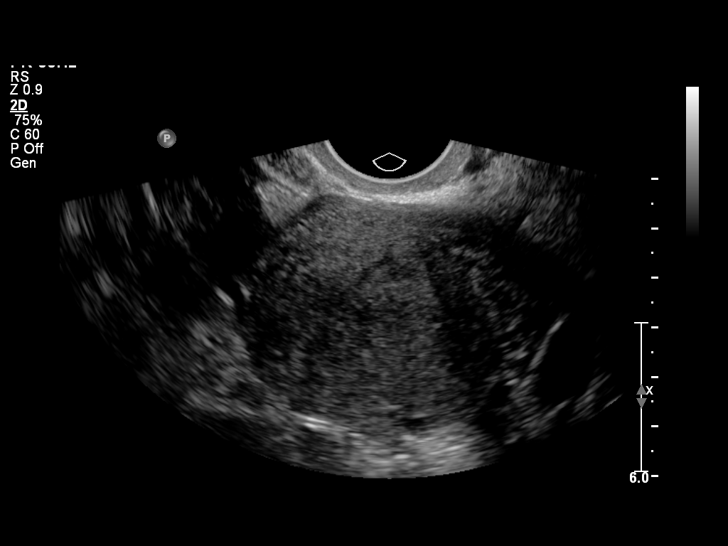
[im 9/26]
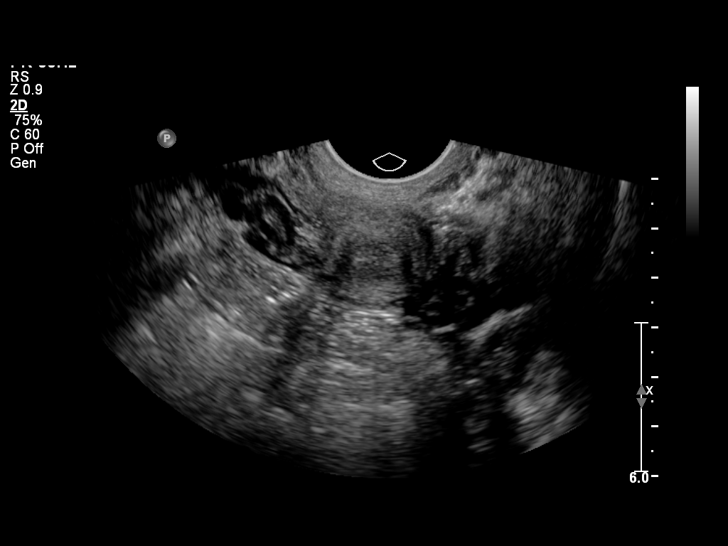
[im 11/26]
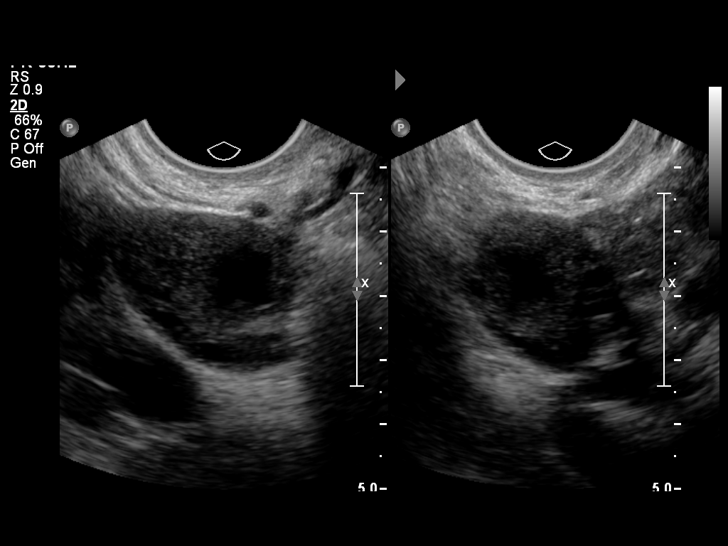
[im 13/26]
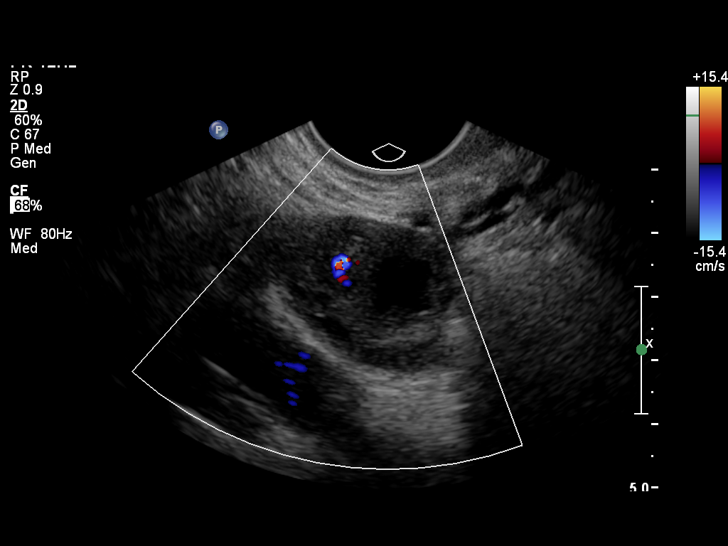
[im 14/26]
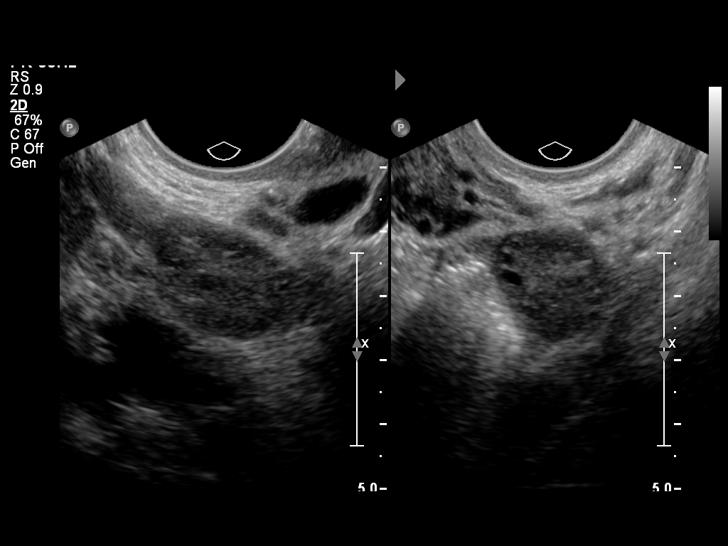
[im 16/26]
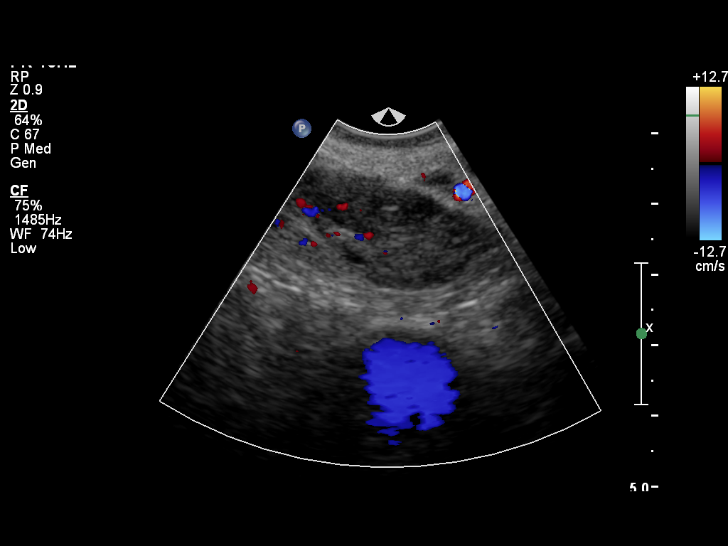
[im 18/26]
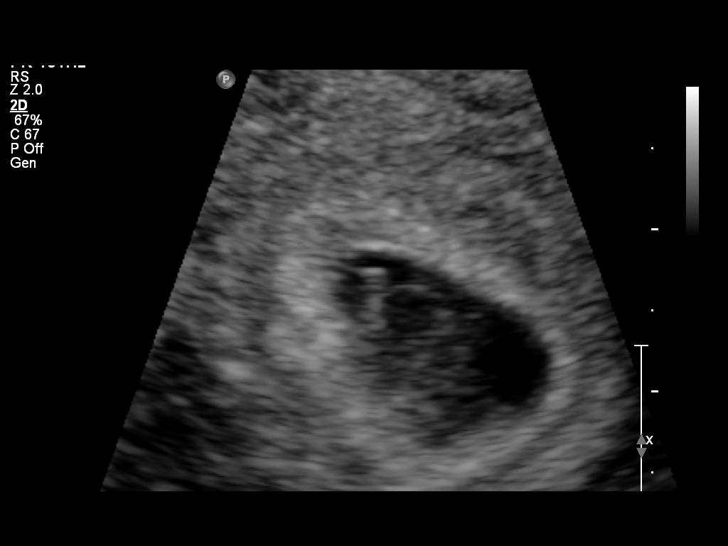
[im 20/26]
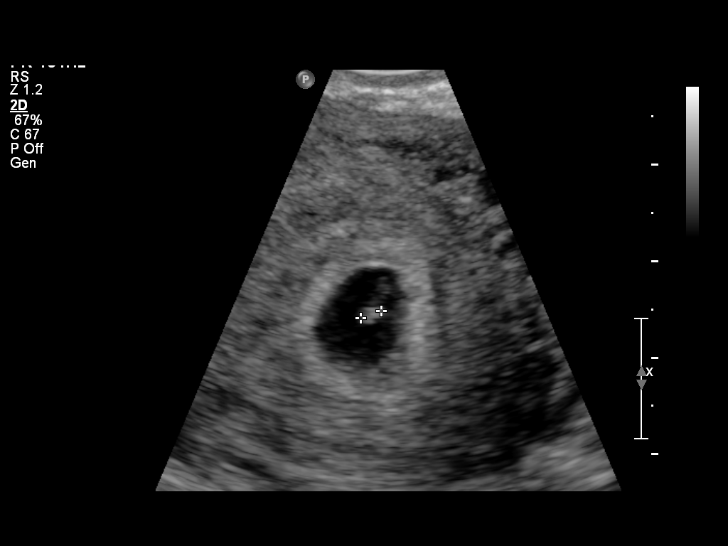
[im 22/26]
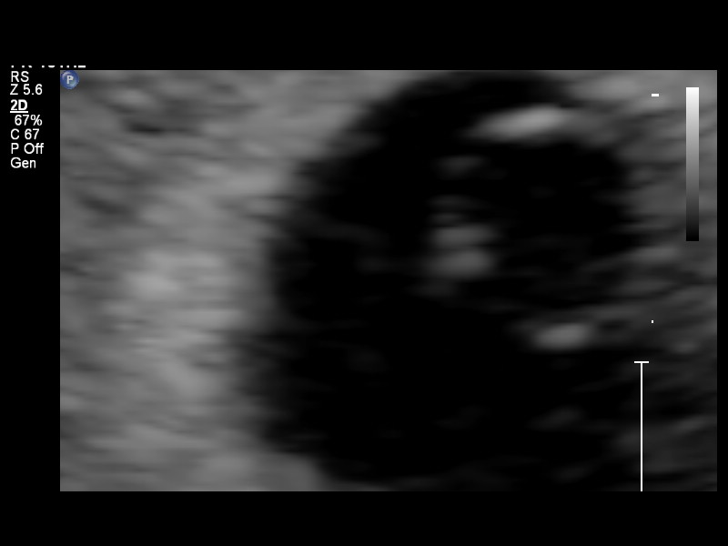
[im 24/26]
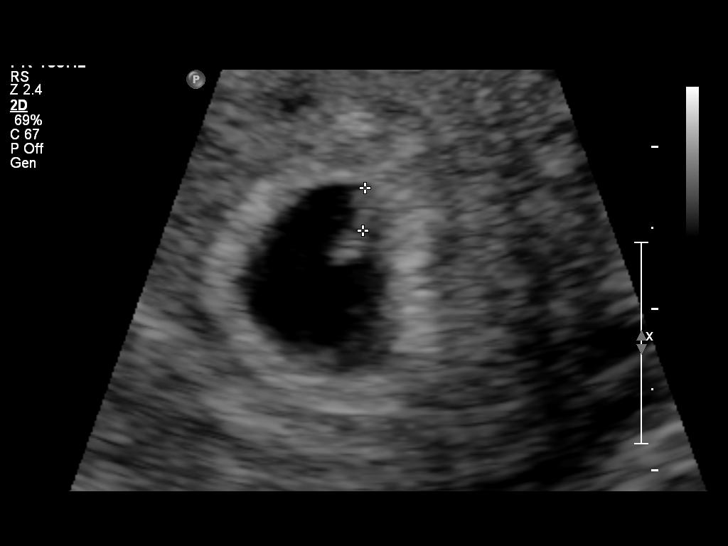
[im 26/26]
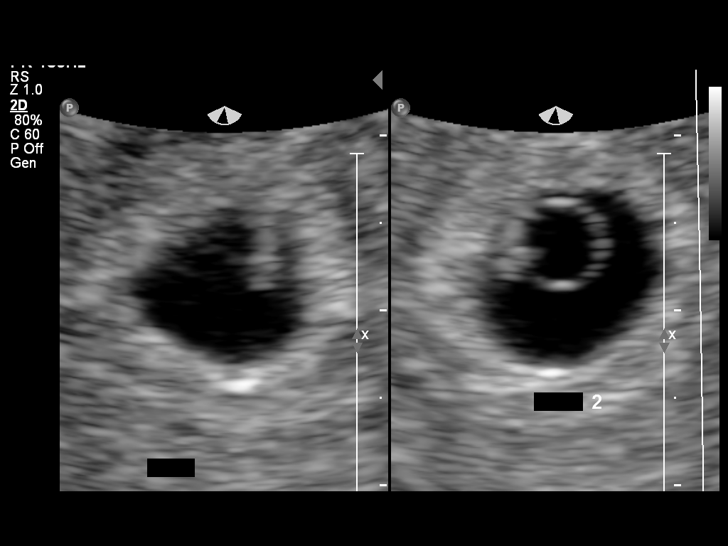

[14 of 26 positions shown; findings below may reference images not displayed]

FINDINGS: TWIN 1

Intrauterine gestational sac: Single intrauterine gestational sac.

Yolk sac:  Single yolk sac present

Embryo:  Present

Cardiac Activity: Present

Heart Rate: 102 bpm

CRL:  2.3  mm   5 w 6 d                  US EDC: 01/03/2014

TWIN 2

Intrauterine gestational sac: Single intrauterine gestational sac

Yolk sac:  Single yolk sac present

Embryo:  Present

Cardiac Activity: Present

Heart Rate: 99 bpm

CRL:  2.6  mm   5 w 6 d                  US EDC: 01/03/2014

Maternal uterus/adnexae: Normal appearing ovaries. No subchorionic
hemorrhage.
IMPRESSION: 1. Monochorionic monoamniotic twin gestation.
2. Size and dates correlate well. By LMP, the patient has EDC of
12/31/2013.
